# Patient Record
Sex: Female | Born: 1950 | Race: White | Hispanic: No | Marital: Married
Health system: Southern US, Community
[De-identification: ages and names within clinical notes are randomized; demographics above are authoritative.]

---

## 1999-07-02 ENCOUNTER — Other Ambulatory Visit: Admission: RE | Admit: 1999-07-02 | Discharge: 1999-07-02 | Payer: Self-pay | Admitting: Family Medicine

## 2000-08-25 ENCOUNTER — Encounter (INDEPENDENT_AMBULATORY_CARE_PROVIDER_SITE_OTHER): Payer: Self-pay | Admitting: Specialist

## 2000-08-25 ENCOUNTER — Other Ambulatory Visit: Admission: RE | Admit: 2000-08-25 | Discharge: 2000-08-25 | Payer: Self-pay | Admitting: Obstetrics and Gynecology

## 2001-02-08 ENCOUNTER — Ambulatory Visit (HOSPITAL_COMMUNITY): Admission: RE | Admit: 2001-02-08 | Discharge: 2001-02-08 | Payer: Self-pay | Admitting: Ophthalmology

## 2003-07-12 ENCOUNTER — Other Ambulatory Visit: Admission: RE | Admit: 2003-07-12 | Discharge: 2003-07-12 | Payer: Self-pay | Admitting: Gynecology

## 2004-07-15 ENCOUNTER — Other Ambulatory Visit: Admission: RE | Admit: 2004-07-15 | Discharge: 2004-07-15 | Payer: Self-pay | Admitting: Gynecology

## 2008-02-10 ENCOUNTER — Ambulatory Visit: Payer: Self-pay | Admitting: Internal Medicine

## 2008-02-16 ENCOUNTER — Ambulatory Visit: Payer: Self-pay | Admitting: Internal Medicine

## 2008-02-21 ENCOUNTER — Encounter: Payer: Self-pay | Admitting: Internal Medicine

## 2008-10-23 ENCOUNTER — Emergency Department (HOSPITAL_COMMUNITY): Admission: EM | Admit: 2008-10-23 | Discharge: 2008-10-24 | Payer: Self-pay | Admitting: Emergency Medicine

## 2008-10-24 ENCOUNTER — Ambulatory Visit (HOSPITAL_COMMUNITY): Admission: AD | Admit: 2008-10-24 | Discharge: 2008-10-24 | Payer: Self-pay | Admitting: Urology

## 2008-12-03 ENCOUNTER — Ambulatory Visit (HOSPITAL_COMMUNITY): Admission: RE | Admit: 2008-12-03 | Discharge: 2008-12-03 | Payer: Self-pay | Admitting: Pulmonary Disease

## 2009-04-21 ENCOUNTER — Emergency Department (HOSPITAL_COMMUNITY): Admission: EM | Admit: 2009-04-21 | Discharge: 2009-04-21 | Payer: Self-pay | Admitting: Emergency Medicine

## 2009-11-11 IMAGING — CT CT PELVIS W/ CM
2 of 5 series · 16 of 46 positions shown, 18 images · IV contrast (Omnipaque 300)
Comparison: 10/23/2008

CT ABDOMEN

CLINICAL DATA: Lower pelvic pain, nausea, vomiting.

CT ABDOMEN AND PELVIS WITH CONTRAST
TECHNIQUE: Multidetector CT imaging of the abdomen and pelvis was
performed using the standard protocol following bolus
administration of intravenous contrast.
Contrast: 100 ml Omnipaque 300 IV.

[Series 2: abd_pel_with 5.0 b40f · axial · 0.64mm/px · z∈[-488,-84]mm · 13 of 93 slices shown, 15 images]
[im 6/93  soft-tissue]
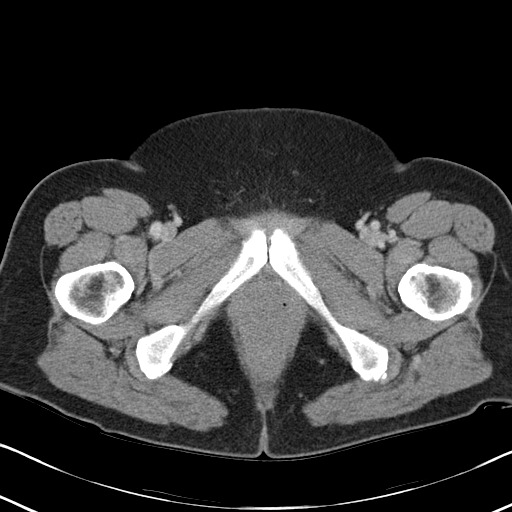
[im 6/93  bone]
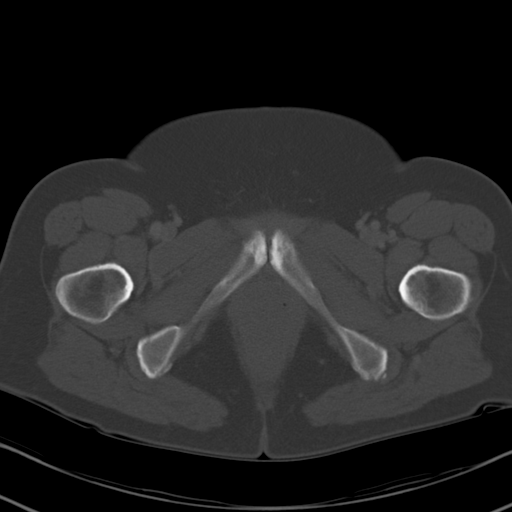
[im 11/93  soft-tissue]
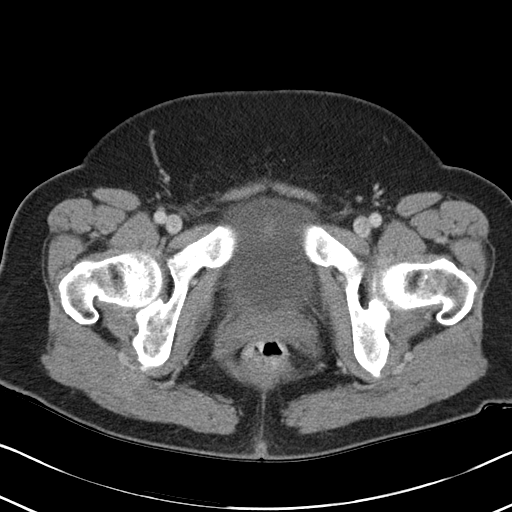
[im 22/93  soft-tissue]
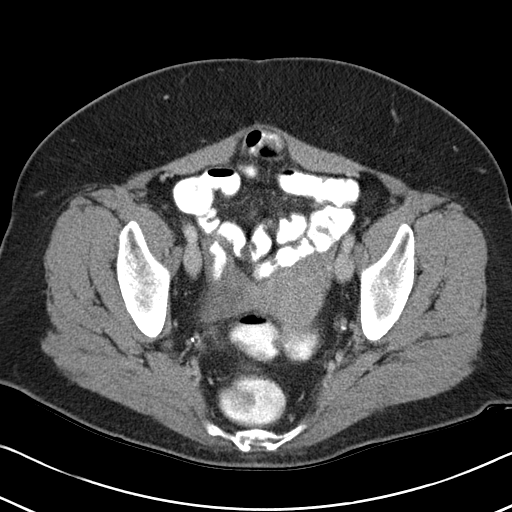
[im 28/93  soft-tissue]
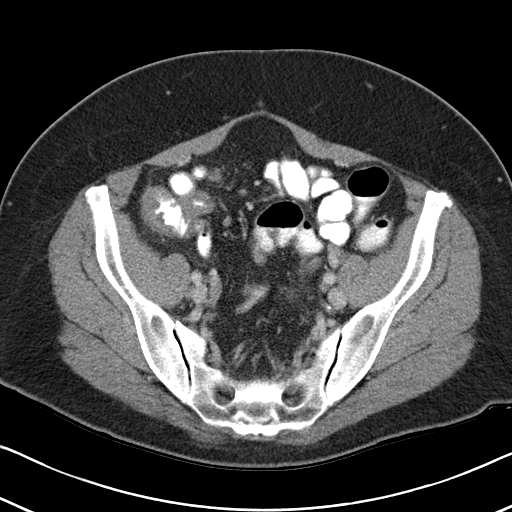
[im 33/93  soft-tissue]
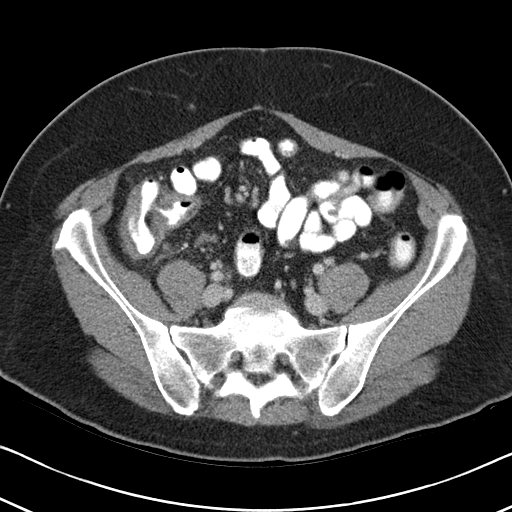
[im 38/93  soft-tissue]
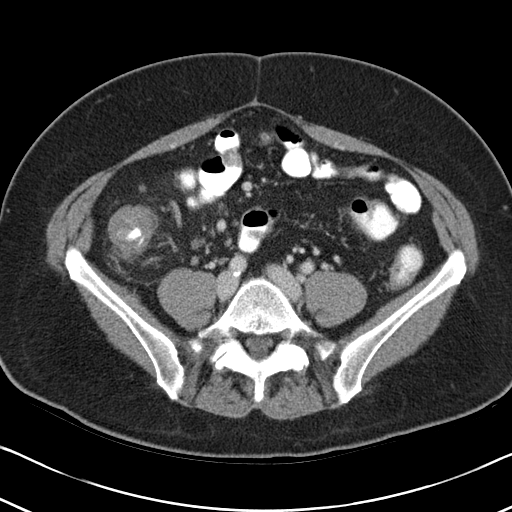
[im 49/93  soft-tissue]
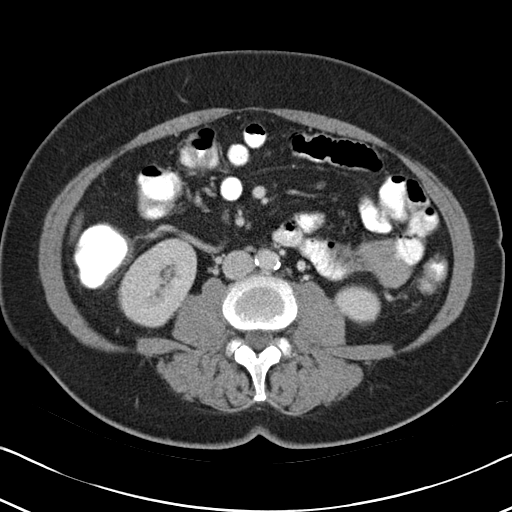
[im 55/93  soft-tissue]
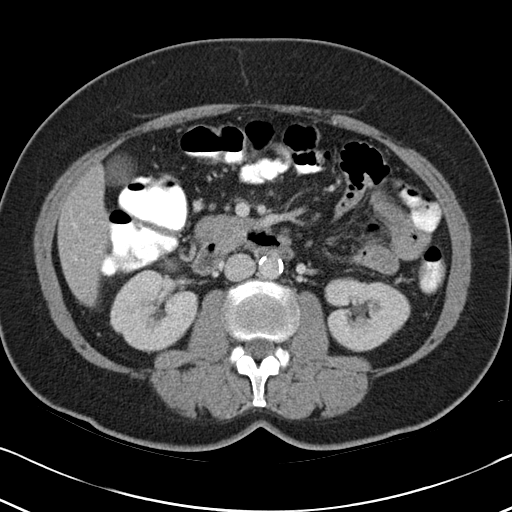
[im 60/93  soft-tissue]
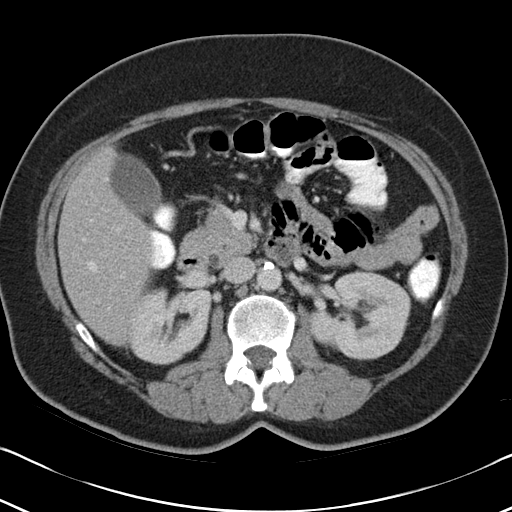
[im 60/93  bone]
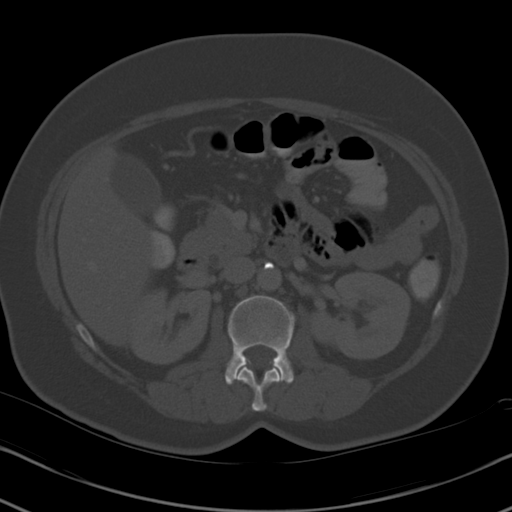
[im 65/93  soft-tissue]
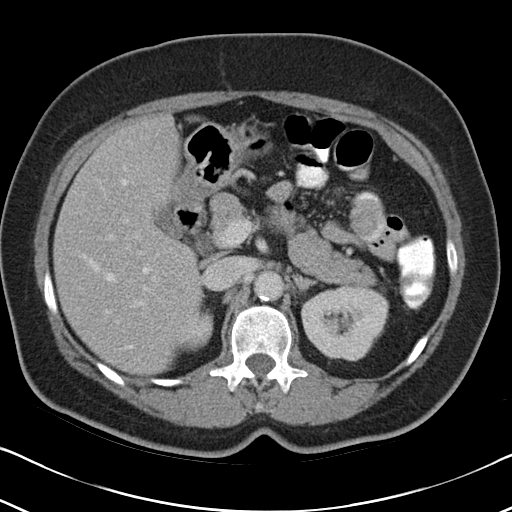
[im 71/93  soft-tissue]
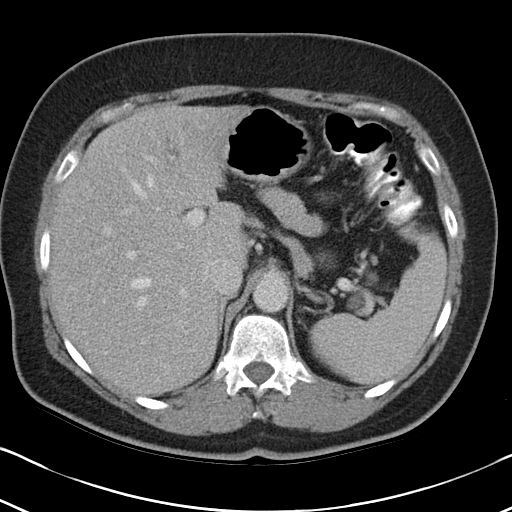
[im 82/93  soft-tissue]
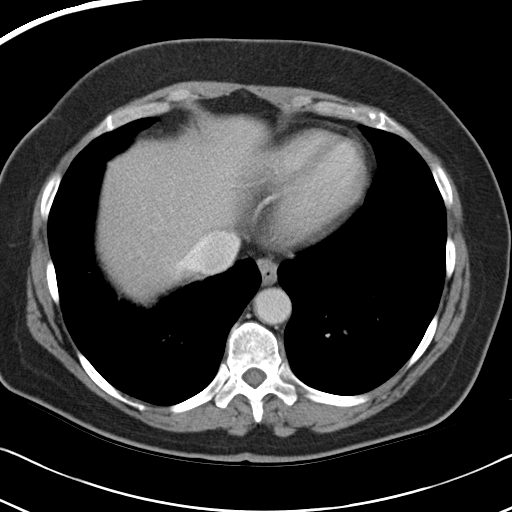
[im 87/93  soft-tissue]
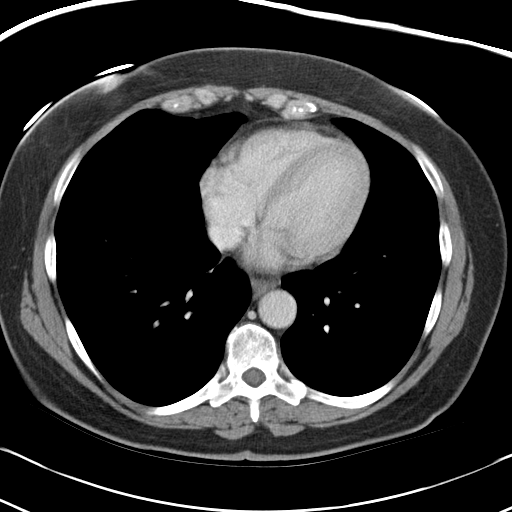

[Series 4: mpr cor post contrast (id) · coronal · 0.65mm/px · 3 of 71 slices shown]
[im 24/71  soft-tissue]
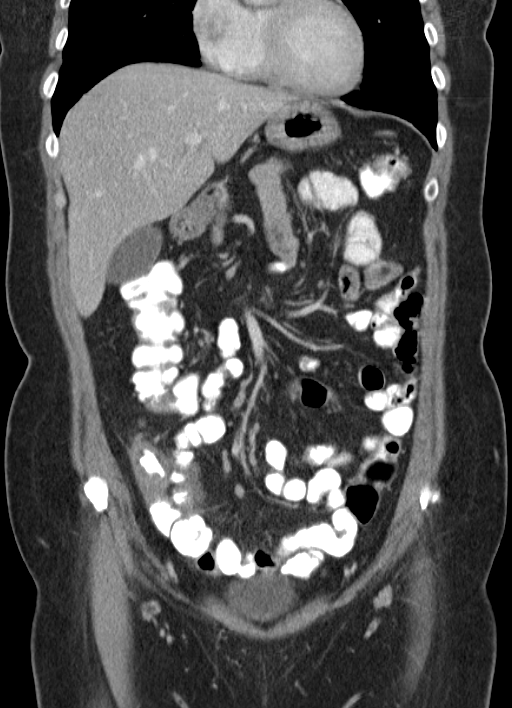
[im 32/71  soft-tissue]
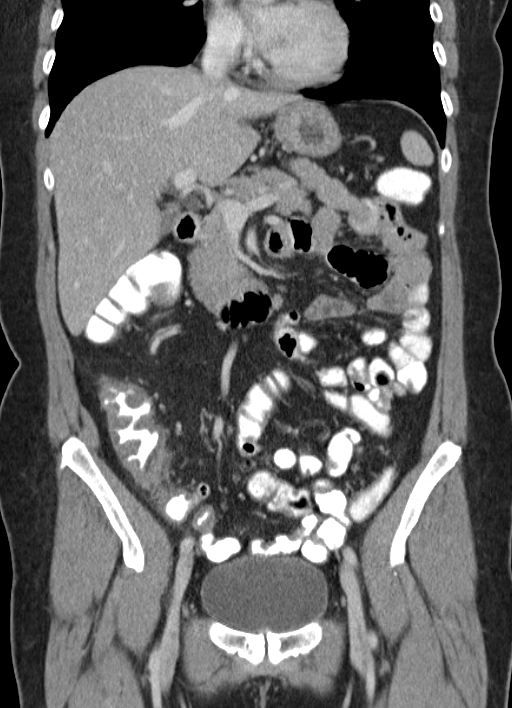
[im 39/71  soft-tissue]
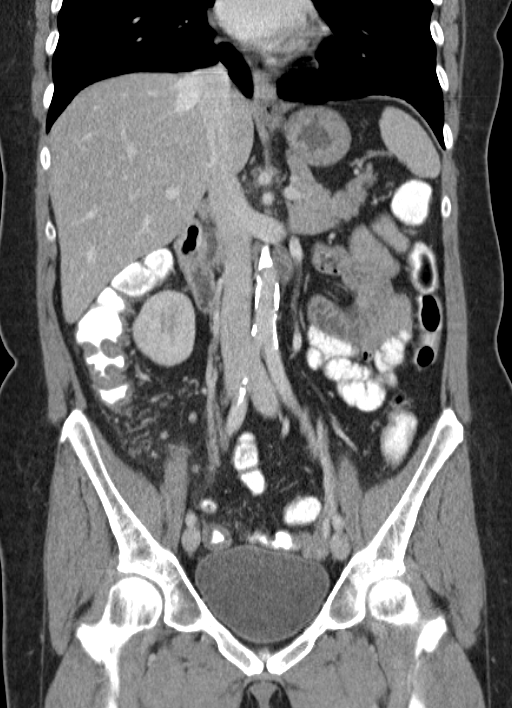

[16 of 46 positions shown; findings below may reference images not displayed]

FINDINGS: Lung bases are clear.  No effusions.  Heart is normal
size.

Mild fatty infiltration of the liver.  Otherwise liver, spleen,
pancreas, adrenals and kidneys unremarkable.  Previously noted
right hydronephrosis has resolved.

There is mild wall thickening in the ascending colon.  See pelvic
CT report below.  Small bowel unremarkable.  No free fluid, free
air or adenopathy.  Atherosclerosis in the aorta without aneurysm.
IMPRESSION: Mild fatty infiltration of the liver.

No acute findings in the abdomen.

CT PELVIS
FINDINGS: There is wall thickening within the terminal ileum,
cecum, and ascending colon.  Adjacent enlarged mesenteric lymph
nodes.  Findings most compatible with colitis/enteritis, most
likely infectious.  No free fluid or free air.  Small bowel
unremarkable.

Uterus and adnexa unremarkable.  No acute bony abnormality.
IMPRESSION: Wall thickening in the terminal ileum, cecum, and ascending colon
with associated prominent mesenteric lymph nodes.  Findings most
compatible with colitis/enteritis, likely infectious.

## 2010-05-09 ENCOUNTER — Other Ambulatory Visit (HOSPITAL_COMMUNITY): Payer: Self-pay | Admitting: Pulmonary Disease

## 2010-05-09 DIAGNOSIS — I700 Atherosclerosis of aorta: Secondary | ICD-10-CM

## 2010-05-13 ENCOUNTER — Ambulatory Visit (HOSPITAL_COMMUNITY)
Admission: RE | Admit: 2010-05-13 | Discharge: 2010-05-13 | Disposition: A | Discharge status: Home / Self Care | Payer: 59 | Source: Ambulatory Visit | Attending: Pulmonary Disease | Admitting: Pulmonary Disease

## 2010-05-13 ENCOUNTER — Other Ambulatory Visit (HOSPITAL_COMMUNITY): Payer: Self-pay | Admitting: Pulmonary Disease

## 2010-05-13 DIAGNOSIS — I700 Atherosclerosis of aorta: Secondary | ICD-10-CM | POA: Insufficient documentation

## 2010-05-13 DIAGNOSIS — I7789 Other specified disorders of arteries and arterioles: Secondary | ICD-10-CM | POA: Insufficient documentation

## 2010-05-30 LAB — HEMOGLOBIN AND HEMATOCRIT, BLOOD: Hemoglobin: 13.1 g/dL (ref 12.0–15.0)

## 2010-05-31 LAB — URINALYSIS, ROUTINE W REFLEX MICROSCOPIC
Glucose, UA: NEGATIVE mg/dL
Ketones, ur: NEGATIVE mg/dL
Protein, ur: NEGATIVE mg/dL
Specific Gravity, Urine: >1.03 — ABNORMAL HIGH (ref 1.005–1.030)
Urobilinogen, UA: 0.2 mg/dL (ref 0.0–1.0)
pH: 5 (ref 5.0–8.0)

## 2010-05-31 LAB — BASIC METABOLIC PANEL
BUN: 13 mg/dL (ref 6–23)
Chloride: 103 mEq/L (ref 96–112)
GFR calc Af Amer: >60 mL/min (ref >60)
GFR calc non Af Amer: >60 mL/min (ref >60)
Glucose, Bld: 131 mg/dL — ABNORMAL HIGH (ref 70–99)
Potassium: 3.9 mEq/L (ref 3.5–5.1)

## 2010-05-31 LAB — DIFFERENTIAL
Basophils Absolute: 0.1 10*3/uL (ref 0.0–0.1)
Basophils Relative: 1 % (ref 0–1)
Eosinophils Relative: 1 % (ref 0–5)
Monocytes Relative: 8 % (ref 3–12)
Neutro Abs: 7.9 10*3/uL — ABNORMAL HIGH (ref 1.7–7.7)
Neutrophils Relative %: 59 % (ref 43–77)

## 2010-05-31 LAB — CBC
HCT: 41 % (ref 36.0–46.0)
MCHC: 34.6 g/dL (ref 30.0–36.0)
RDW: 13.8 % (ref 11.5–15.5)
WBC: 13.4 10*3/uL — ABNORMAL HIGH (ref 4.0–10.5)

## 2010-05-31 LAB — URINE MICROSCOPIC-ADD ON

## 2010-07-08 NOTE — Op Note (Signed)
NAME:  LAURIA, DEPOY                  ACCOUNT NO.:  [id number]   MEDICAL RECORD NO.:  [id number]          PATIENT TYPE:  AMB   LOCATION:  DAY                          FACILITY:  [facility]   PHYSICIAN:  Heloise Purpura, MD      DATE OF BIRTH:  [birth year]   DATE OF PROCEDURE:  10/24/2008  DATE OF DISCHARGE:  10/24/2008                               OPERATIVE REPORT   PREOPERATIVE DIAGNOSIS:  Right ureteral calculus.   POSTOPERATIVE DIAGNOSIS:  Right ureteral calculus.   PROCEDURE:  1. Cystoscopy.  2. Right retrograde pyelography.  3. Right ureteroscopy with laser lithotripsy and stone removal.   SURGEON:  Dr. Heloise Purpura.   ANESTHESIA:  General.   COMPLICATIONS:  None.   INDICATION:  Ms. Yvonne Skinner is a 60 year old female who developed severe  right-sided flank pain yesterday.  She was found to have a distal 7 mm  right ureteral stone.  After discussing management options, she elected  to proceed with the above procedure for treatment.  The potential risks,  complications, and alternative treatment options were discussed in  detail and informed consent was obtained.   DESCRIPTION OF PROCEDURE:  The patient was taken to the operating room  and a general anesthetic was administered.  She was given preoperative  antibiotics, placed in the dorsal lithotomy position, and prepped and  draped in the usual sterile fashion.  Next a preoperative timeout was  performed.  Cystourethroscopy was then performed and fluoroscopy was  utilized to identify a radiopaque stone over the vicinity of the distal  right ureter.  Omnipaque contrast was then injected through a 6-French  ureteral catheter into the right ureter which demonstrated filling  defect in the distal right ureter, consistent with a ureteral calculus.  A 0.038 Sensor guidewire was then advanced up to the ureteral catheter  and into the right renal pelvis under fluoroscopic guidance.  Utilizing  the 6-French semi-rigid ureteroscope,  ureteroscopy was performed without  the need for ureteral dilation.  The stone was identified and a 365  micron fiber was used to perform laser lithotripsy.  The stone fragments  were then removed with a 0 tip nitinol basket.  The stone fragments were  sent for analysis.  On repeat ureteroscopy, no further stones  were identified and the ureter was examined in its entirety up to the  level of the UPJ.  It was decided that a stent would not be necessary  based on the uncomplicated procedure.  Her bladder was emptied and she  was able to be awakened and transferred to the recovery unit in  satisfactory condition.      Heloise Purpura, MD  Electronically Signed     LB/MEDQ  D:  10/24/2008  T:  10/25/2008  Job:  [id number]

## 2010-10-14 ENCOUNTER — Ambulatory Visit (HOSPITAL_COMMUNITY)
Admission: RE | Admit: 2010-10-14 | Discharge: 2010-10-14 | Disposition: A | Discharge status: Home / Self Care | Payer: 59 | Source: Ambulatory Visit | Attending: Pulmonary Disease | Admitting: Pulmonary Disease

## 2010-10-14 ENCOUNTER — Other Ambulatory Visit (HOSPITAL_COMMUNITY): Payer: Self-pay | Admitting: Pulmonary Disease

## 2010-10-14 DIAGNOSIS — R51 Headache: Secondary | ICD-10-CM | POA: Insufficient documentation

## 2012-06-22 ENCOUNTER — Ambulatory Visit: Payer: 59 | Admitting: Orthopedic Surgery

## 2014-08-24 ENCOUNTER — Encounter: Payer: Self-pay | Admitting: Internal Medicine

## 2014-11-09 ENCOUNTER — Telehealth: Payer: Self-pay | Admitting: Internal Medicine

## 2014-11-09 NOTE — Telephone Encounter (Signed)
Moved to the [employer]
